# Patient Record
Sex: Female | Born: 1950 | Race: White | Hispanic: No | Marital: Married | Smoking: Never smoker
Health system: Southern US, Community
[De-identification: ages and names within clinical notes are randomized; demographics above are authoritative.]

## PROBLEM LIST (undated history)

## (undated) DIAGNOSIS — M1990 Unspecified osteoarthritis, unspecified site: Secondary | ICD-10-CM

## (undated) DIAGNOSIS — Z889 Allergy status to unspecified drugs, medicaments and biological substances status: Secondary | ICD-10-CM

## (undated) DIAGNOSIS — F32A Depression, unspecified: Secondary | ICD-10-CM

## (undated) DIAGNOSIS — C801 Malignant (primary) neoplasm, unspecified: Secondary | ICD-10-CM

## (undated) DIAGNOSIS — E039 Hypothyroidism, unspecified: Secondary | ICD-10-CM

## (undated) DIAGNOSIS — F419 Anxiety disorder, unspecified: Secondary | ICD-10-CM

## (undated) DIAGNOSIS — Z8719 Personal history of other diseases of the digestive system: Secondary | ICD-10-CM

## (undated) DIAGNOSIS — F329 Major depressive disorder, single episode, unspecified: Secondary | ICD-10-CM

## (undated) DIAGNOSIS — R7303 Prediabetes: Secondary | ICD-10-CM

## (undated) DIAGNOSIS — I10 Essential (primary) hypertension: Secondary | ICD-10-CM

## (undated) HISTORY — PX: WISDOM TOOTH EXTRACTION: SHX21

## (undated) HISTORY — PX: TONSILLECTOMY: SUR1361

## (undated) HISTORY — PX: COLONOSCOPY: SHX174

## (undated) HISTORY — PX: IUD REMOVAL: SHX5392

## (undated) HISTORY — PX: REPLACEMENT TOTAL KNEE: SUR1224

## (undated) HISTORY — PX: TUBAL LIGATION: SHX77

---

## 2002-02-13 ENCOUNTER — Ambulatory Visit (HOSPITAL_COMMUNITY): Admission: RE | Admit: 2002-02-13 | Discharge: 2002-02-13 | Payer: Self-pay | Admitting: Obstetrics and Gynecology

## 2002-02-13 ENCOUNTER — Encounter (INDEPENDENT_AMBULATORY_CARE_PROVIDER_SITE_OTHER): Payer: Self-pay | Admitting: *Deleted

## 2002-03-01 ENCOUNTER — Ambulatory Visit (HOSPITAL_COMMUNITY): Admission: RE | Admit: 2002-03-01 | Discharge: 2002-03-01 | Payer: Self-pay | Admitting: Physician Assistant

## 2002-03-01 ENCOUNTER — Encounter: Payer: Self-pay | Admitting: Obstetrics and Gynecology

## 2003-03-28 ENCOUNTER — Other Ambulatory Visit: Admission: RE | Admit: 2003-03-28 | Discharge: 2003-03-28 | Payer: Self-pay | Admitting: Obstetrics and Gynecology

## 2004-04-20 ENCOUNTER — Other Ambulatory Visit: Admission: RE | Admit: 2004-04-20 | Discharge: 2004-04-20 | Payer: Self-pay | Admitting: Obstetrics and Gynecology

## 2004-08-24 ENCOUNTER — Other Ambulatory Visit: Admission: RE | Admit: 2004-08-24 | Discharge: 2004-08-24 | Payer: Self-pay | Admitting: Obstetrics and Gynecology

## 2005-07-16 ENCOUNTER — Other Ambulatory Visit: Admission: RE | Admit: 2005-07-16 | Discharge: 2005-07-16 | Payer: Self-pay | Admitting: Obstetrics and Gynecology

## 2005-09-20 ENCOUNTER — Other Ambulatory Visit: Admission: RE | Admit: 2005-09-20 | Discharge: 2005-09-20 | Payer: Self-pay | Admitting: Obstetrics and Gynecology

## 2006-05-23 ENCOUNTER — Ambulatory Visit (HOSPITAL_COMMUNITY): Admission: RE | Admit: 2006-05-23 | Discharge: 2006-05-23 | Payer: Self-pay | Admitting: Obstetrics and Gynecology

## 2006-08-31 ENCOUNTER — Encounter (INDEPENDENT_AMBULATORY_CARE_PROVIDER_SITE_OTHER): Payer: Self-pay | Admitting: *Deleted

## 2006-08-31 ENCOUNTER — Ambulatory Visit: Admission: RE | Admit: 2006-08-31 | Discharge: 2006-08-31 | Payer: Self-pay | Admitting: Gynecologic Oncology

## 2006-08-31 ENCOUNTER — Other Ambulatory Visit: Admission: RE | Admit: 2006-08-31 | Discharge: 2006-08-31 | Payer: Self-pay | Admitting: Gynecologic Oncology

## 2008-03-07 ENCOUNTER — Encounter: Admission: RE | Admit: 2008-03-07 | Discharge: 2008-03-07 | Payer: Self-pay | Admitting: Obstetrics and Gynecology

## 2010-08-24 ENCOUNTER — Encounter: Payer: Self-pay | Admitting: Obstetrics and Gynecology

## 2010-09-11 ENCOUNTER — Other Ambulatory Visit: Payer: Self-pay | Admitting: Plastic Surgery

## 2010-12-18 NOTE — Op Note (Signed)
[facility]  Patient:    Danielle Torres, Danielle Torres Visit Number: [id number] MRN: [id number]          Service Type: DSU Location: [facility] Attending Physician:  Soledad Gerlach Dictated by:   Guy Sandifer Arleta Creek, M.D. Proc. Date: 02/13/02 Admit Date:  02/13/2002 Discharge Date: 02/13/2002                             Operative Report  PREOPERATIVE DIAGNOSES:       Retained intrauterine device.  POSTOPERATIVE DIAGNOSES:      Retained intrauterine device.  PROCEDURE:                    Hysteroscopy with dilatation and curettage.  SURGEON:                      Guy Sandifer. Arleta Creek, M.D.  ANESTHESIA:                   General with LMA.  ESTIMATED BLOOD LOSS:         Less than 30 cc.  INPUT AND OUTPUT:             Sorbitol distending media 30 cc deficit.  INDICATIONS AND CONSENT:      This patient is a 60 year old married white female G2, P2 status post tubal ligation who has an IUD in place for 25 years. Details are dictated in the history and physical.  Hysteroscopy, D&C is discussed with the patient.  Potential risks and complications were discussed including, but not limited to, infection, bowel, bladder, ureteral damage, uterine perforation, bleeding requiring transfusion of blood products with possible transfusion reaction, HIV and hepatitis acquisition, DVT, PE, pneumonia, laparotomy, laparoscopy, and hysterectomy.  All questions have been answered and consent is signed on the chart.  FINDINGS:                     The internal cervical os is somewhat stenotic. The end of the IUD is visualized immediately superior to the internal cervical os.  No other abnormal structures are noted.  PROCEDURE:                    The patient is taken to the operating room, placed in the dorsal supine position where general anesthesia is administered via LMA.  She is then placed in the dorsal lithotomy position where she is prepped, bladder straight  catheterized, and she is draped in a sterile fashion.  Bivalve speculum is placed in the vagina and the anterior cervical lip is injected with 1% Xylocaine and grasped with a single tooth tenaculum. Paracervical block is placed at the 2, 4, 5, 7, 8, and 10 oclock positions with approximately 19 cc total of 1% Xylocaine plain.  Cervix is then gently progressively dilated to a 25 Pratt dilator.  Diagnostic hysteroscope is placed and the above findings are noted.  Hysteroscope is withdrawn.  Randall stone forceps are used to grasp the IUD which is removed intact without difficulty.  Hysteroscope is then reintroduced and no abnormal structures are noted.  Hysteroscope is then withdrawn.  Sharp curettage is carried out for a scant amount of tissue.  This is sent to pathology.  All instruments are removed.  All counts are correct.  The patient is awakened and taken to recovery room in stable condition. Dictated by:   Guy Sandifer Tomblin  II, M.D. Attending Physician:  Soledad Gerlach DD:  02/13/02 TD:  02/16/02 Job: [id number]

## 2010-12-18 NOTE — H&P (Signed)
[facility]  Patient:    Danielle Torres, Danielle Torres Visit Number: [id number] MRN: [id number]          Service Type: Attending:  Guy Sandifer. Arleta Creek, M.D. Dictated by:   Guy Sandifer Arleta Creek, M.D. Adm. Date:  02/13/02                           History and Physical  REASON FOR ADMISSION:         Retained IUD.  HISTORY OF PRESENT ILLNESS:   This patient is a 60 year old, married, white female, G2, P2, status post tubal ligation, who thought her IUD was removed at the time of tubal ligation approximately 25 years ago.  She had had multiple pelvic exams with apparently no abnormality noted.  Her last menstrual period was March 2002.  She is having an occasional hot flash.  However, she began having some left lower quadrant pain about three weeks ago which migrated to the right side.  This pain comes and goes and is often times associated with movement.  She was initially evaluated by Kipp Brood in [place], [place].  That evaluation included a flat plate ultrasound which was suspicious for a pelvic structure.  Subsequent pelvic ultrasound revealed a uterus measuring 6.9 x 3.14 x 3.77 cm.  An IUD was located in the endometrial canal.  The ovaries were described as being normal, and there was no adnexal masses or fluid in the cul-de-sac.  Examination revealed an anteverted, normally-sized, mildly tender uterus and a negative adnexal exam. There were no IUD strings.  A CBC was consistent with a white count of 6.6 and a hemoglobin of 14.4.  The patient was given Levaquin orally and returned the next day for an attempted removal of the IUD.  However, I was unable to pass the instrument beyond the internal cervical os.  The patient is therefore being admitted for hysteroscopy D&C for IUD removal.  She has been maintained on Levaquin 500 mg daily.  Potential risks and complications have been discussed, and all questions have been answered.  PAST MEDICAL HISTORY:          1. Hypothyroidism.                               2. Chronic hypertension.                               3. History of colitis.  PAST SURGICAL HISTORY:        1. Tonsillectomy in 1958.                               2. Tubal ligation in 1981.  MEDICATIONS:                  1. Synthroid 30 mcg t.i.d.                               2. Norvasc 5 mg 1/2 pill q.d.                               3. Spironolactone q.d.  4. Avandia q.d.                               5. Allegra p.r.n.                               6. Potassium q.d.  ALLERGIES:                    PENICILLIN leading to swelling, shortness of breath.  ALTACE leading to mouth swelling, shortness of breath, and confusion.  FAMILY HISTORY:               Positive for diabetes in father, aunts, and uncles.  Heart disease in father.  Heart attack in father.  Skin and colon cancer in maternal grandfather and paternal grandmother.  OBSTETRIC HISTORY:            Two vaginal deliveries.  REVIEW OF SYSTEMS:            The patient complains of generalized fatigue for the past two years.  PHYSICAL EXAMINATION:  VITAL SIGNS:                  Blood pressure 148/78.  HEENT:                        Without thyromegaly.  LUNGS:                        Clear to auscultation.  HEART:                        Regular rate and rhythm.  BACK:                         Without CVA tenderness.  BREASTS:                      Without mass, retraction, discharge.  ABDOMEN:                      Soft, nontender, without masses.  PELVIC:                       Vulva, vagina, cervix without lesion.  There is no IUD string.  Uterus anteverted, normal size, mildly tender.  Adnexa nontender without masses.  EXTREMITIES/NEUROLOGIC:       Grossly within normal limits.  ASSESSMENT:                   Retained intrauterine device.  PLAN:                         Hysteroscopy D&C. Dictated by:   Guy Sandifer Arleta Creek,  M.D. Attending:  Guy Sandifer Arleta Creek, M.D. DD:  02/12/02 TD:  02/12/02 Job: [id number]

## 2010-12-18 NOTE — Consult Note (Signed)
NAMEREANNAH, Danielle Torres               ACCOUNT NO.:  [id number]   MEDICAL RECORD NO.:  [id number]          PATIENT TYPE:  OUT   LOCATION:  GYN                          FACILITY:  [facility]   PHYSICIAN:  Paola A. Duard Brady, MD    DATE OF BIRTH:  1951-02-18   DATE OF CONSULTATION:  08/31/2006  DATE OF DISCHARGE:                                 CONSULTATION   REFERRED BY:  Dr. Harold Hedge. Patient seen today in consultation at  the request of Dr. Harold Hedge.   Ms. Baltes is a 60 year old, gravida 2, para 2 who has had about a 2-3  year history of abdominal pain and was told she had colitis, has  intermittent diarrhea, etc. She has also gained approximately 35-40  pounds in the past 2 years. She attributes this to feeling tired. She is  eating about the same but she is not exercising as she used to. Because  of these symptoms of abdominal discomfort and weight gain, she had a CA  125 ordered by her primary physician, Dr. Burna Mortimer. Her first CA 125 was  done September 2007 and it was 24 with their reference range of normal  being less than 20. She was subsequently referred to Dr. Henderson Cloud. A  transvaginal ultrasound was performed that revealed no evidence of any  issues, normal appearing ovaries and uterus. She did have a CT scan of  the abdomen and pelvis October 2007 that revealed mild diffuse fatty  infiltration of the liver. There was no liver lesions noted. The  gallbladder was normal, the pancreas, spleen, adrenals and kidneys were  also normal in appearance. There was no evidence of any masses or  adenopathy. There was no evidence of an inflammatory process or abnormal  fluid collection. Abdominal loops were negative. Within the pelvis, the  uterus was normal in size, there was no evidence of any pelvic or  adnexal masses. There was no abnormal fluid collections noted. In  addition, there was no evidence of inflammatory processes or bowel  thickening. There were no dilated loops of small  bowel and it was noted  to be negative. It was recommended that she have a GI evaluation  secondary to fatty infiltration of the liver. I am not sure if that  occurred; however, she had a repeat CA 125 done December 7 that was 26,  again elevated with the range of 20. It is because of this that she is  referred to [place] today. Her symptoms and complaints are essentially as  above. She has been menopausal since the age of 60. She did hormone  replacement therapy for approximately 6 years in her perimenopausal  period, stopped it secondary to weight gain, bloating and cramping. She  currently states that her fatigue is a significant issue for her. She is  on thyroid but states that her last thyroid level was too low despite  being on Armour Thyroid and she feels that she needs an increased dose  of her thyroid medication. She denies any nausea, vomiting, early  satiety. She states that she feels bloated all the time independent of  eating and her clothes are fitting about the same other than the  acknowledged weight gain as above. She has had a negative colonoscopy  done in followup of her colitis.   MEDICATIONS:  Armour Thyroid, Lexapro, K-Dur and Adipex.   PAST MEDICAL HISTORY:  Hypothyroidism.   PAST SURGICAL HISTORY:  Tubal ligation. IUD removal. She has had 2  spontaneous vaginal deliveries.   ALLERGIES:  PENICILLIN which causes shortness of breath and swelling.  ACE inhibitor which cause swelling and confusion. SULFA causes itching.   SOCIAL HISTORY:  She denies a history of tobacco or alcohol. She is  married. She has 2 children and 3 grandchildren. 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   FAMILY HISTORY:  Significant for diabetes. Her mother had Alzheimer's.  She had a grandfather with skin cancer. There is significant cardiac  disease and congestive heart failure. She had a paternal grandmother who  had colon cancer a the age of 23.   HEALTH  MAINTENANCE:  She up-to-date on her mammogram which was March  2007. Colonoscopy she is up-to-date. Pap smears due tomorrow with Dr.  Henderson Cloud.   PHYSICAL EXAMINATION:  VITAL SIGNS:  Height 5 foot 7, weight 210 pounds,  blood pressure 138/78, pulse 80, respirations 18.  GENERAL:  Well-nourished, well-developed female in no acute distress.  NECK:  Supple, there is no lymphadenopathy. No thyromegaly.  LUNGS:  Clear to auscultation bilaterally.  CARDIOVASCULAR:  Regular rate and rhythm.  ABDOMEN:  Soft, nontender, nondistended. There are no palpable masses or  hepatosplenomegaly. Exam is somewhat limited by habitus. There is no  fluid wave, there is no tenderness. Groins are negative for adenopathy.  EXTREMITIES:  There is no edema.  PELVIC:  External genitalia is within normal limits. The vagina is  slightly atrophic with loss of rugations. The cervix is visualized. It  is multiparous. There is a physiologic discharge. There is no visible  lesions. Thin prep Pap was submitted without difficulty. Bimanual  examination limited by habitus; however, the corpus was normal size,  shape and consistency. I could not appreciate any adnexal masses.  Rectovaginal examination confirms no nodularity.   ASSESSMENT:  A 60 year old whose referred to use secondary to elevated  CA 125 which were drawn secondary to complaints of abdominal pain and  bloating that has been going on for 2-3 years. The patient has had  approximately a 35 pound weight gain in the past 2 years which she  attributes to not exercising and eating her baseline amount of food.   PLAN:  I discussed with the patient that CA 125 is a very nonspecific  test. Fatty infiltration in and of itself as well as colitis can  increase her CA 125. However, we have this elevated value and I think it  is our responsibility to continue followup with it. I would recommend a repeat CA 125 in 3 months. If it has normalized or is staying stable, I   would not continually check CA 125. This can be done locally in  [place] through Dr. Burna Mortimer office to ensure consistency with the  labs. Her questions were elicited and answered to her satisfaction. She  knows we will be happy to see her in the future should the need arise.  She was given my card as well as that of Telford Nab. Our business  cards have the fax number so that Dr. Burna Mortimer office can fax her next CA  125 to [place].  Paola A. Duard Brady, MD  Electronically Signed     PAG/MEDQ  D:  08/31/2006  T:  08/31/2006  Job:  [id number]   cc:   Telford Nab, R.N.  [address]  [place], [place] [address]   Guy Sandifer. Henderson Cloud, M.D.  Fax: [phone]   Arma Heading, MD

## 2011-01-27 ENCOUNTER — Other Ambulatory Visit: Payer: Self-pay | Admitting: Plastic Surgery

## 2012-11-27 ENCOUNTER — Other Ambulatory Visit: Payer: Self-pay | Admitting: Gastroenterology

## 2014-02-18 ENCOUNTER — Other Ambulatory Visit: Payer: Self-pay | Admitting: Plastic Surgery

## 2015-01-14 ENCOUNTER — Other Ambulatory Visit: Payer: Self-pay | Admitting: Obstetrics and Gynecology

## 2015-01-15 LAB — CYTOLOGY - PAP

## 2016-05-02 HISTORY — PX: KNEE SURGERY: SHX244

## 2017-07-01 NOTE — Patient Instructions (Addendum)
Your procedure is scheduled on: Thursday December 6 at 7:30 am  Enter through the [employer] [facility] at: 6:00 am  Pick up the phone at the desk and dial [phone].  Call this number if you have problems the morning of surgery: [phone].  Remember: Do NOT eat food or drink any liquids: after Midnight on Wednesday December 5  Take these medicines the morning of surgery with a SIP OF WATER: atenolol, lexapro,  Nature-throid, Dymista spray  STOP ALL HERBAL AND SUPPLEMENTS 1 WEEK PRIOR TO SURGERY  DO NOT SMOKE DAY OF SURGERY  Do NOT wear jewelry (body piercing), metal hair clips/bobby pins, make-up, or nail polish. Do NOT wear lotions, powders, or perfumes.  You may wear deoderant. Do NOT shave for 48 hours prior to surgery. Do NOT bring valuables to the hospital. Contacts, dentures, or bridgework may not be worn into surgery.  Leave suitcase in car.  After surgery it may be brought to your room.  For patients admitted to the hospital, checkout time is 11:00 AM the day of discharge.

## 2017-07-04 ENCOUNTER — Encounter (HOSPITAL_COMMUNITY): Payer: Self-pay

## 2017-07-04 ENCOUNTER — Encounter (HOSPITAL_COMMUNITY)
Admission: RE | Admit: 2017-07-04 | Discharge: 2017-07-04 | Disposition: A | Discharge status: Home / Self Care | Payer: Medicare Other | Source: Ambulatory Visit | Attending: Obstetrics and Gynecology | Admitting: Obstetrics and Gynecology

## 2017-07-04 ENCOUNTER — Other Ambulatory Visit: Payer: Self-pay

## 2017-07-04 DIAGNOSIS — Z01812 Encounter for preprocedural laboratory examination: Secondary | ICD-10-CM | POA: Insufficient documentation

## 2017-07-04 HISTORY — DX: Major depressive disorder, single episode, unspecified: F32.9

## 2017-07-04 HISTORY — DX: Hypothyroidism, unspecified: E03.9

## 2017-07-04 HISTORY — DX: Unspecified osteoarthritis, unspecified site: M19.90

## 2017-07-04 HISTORY — DX: Depression, unspecified: F32.A

## 2017-07-04 HISTORY — DX: Essential (primary) hypertension: I10

## 2017-07-04 HISTORY — DX: Anxiety disorder, unspecified: F41.9

## 2017-07-04 LAB — TYPE AND SCREEN
ABO/RH(D): O POS
ANTIBODY SCREEN: NEGATIVE

## 2017-07-04 LAB — ABO/RH: ABO/RH(D): O POS

## 2017-07-04 NOTE — Pre-Procedure Instructions (Signed)
Dr. Frankey Shown aware of  and  okay 'd patients history, viewed EKG and Labs patient brought with her to PAT appointment.

## 2017-07-06 NOTE — H&P (Signed)
Danielle Torres is an 66 y.o. female with symptomatic pelvic prolapse.  Pertinent Gynecological History: Menses: post-menopausal Bleeding: N/A Contraception: none DES exposure: denies Blood transfusions: none Sexually transmitted diseases: no past history Previous GYN Procedures: BTL  Last mammogram: normal Date: 2018 Last pap: normal Date: 2018 OB History: G2, P2   Menstrual History: Menarche age: unknown No LMP recorded.    Past Medical History:  Diagnosis Date  . Anxiety   . Arthritis    both knees, right knee replacement  . Depression   . Diabetes mellitus without complication ([place])    pre diabetic  . Hypertension   . Hypothyroidism     Past Surgical History:  Procedure Laterality Date  . KNEE SURGERY Right 05/2016  . TONSILLECTOMY    . TUBAL LIGATION      No family history on file.  Social History:  reports that  has never smoked. she has never used smokeless tobacco. She reports that she does not drink alcohol or use drugs.  Allergies:  Allergies  Allergen Reactions  . Ace Inhibitors Shortness Of Breath and Other (See Comments)    Confusion  . Penicillins Shortness Of Breath, Itching and Other (See Comments)    Has patient had a PCN reaction causing immediate rash, facial/tongue/throat swelling, SOB or lightheadedness with hypotension: Yes Has patient had a PCN reaction causing severe rash involving mucus membranes or skin necrosis: No Has patient had a PCN reaction that required hospitalization: No Has patient had a PCN reaction occurring within the last 10 years: No If all of the above answers are "NO", then may proceed with Cephalosporin use.   . Ciprofloxacin Itching  . Guaifenesin & Derivatives Other (See Comments)    Heart rate increased  . Hydralazine Other (See Comments)    Unknown  . Norvasc [Amlodipine Besylate] Swelling  . Sulfacetamide Itching  . Clindamycin/Lincomycin Rash    No medications prior to admission.    ROS  There were  no vitals taken for this visit. Physical Exam  Cardiovascular: Normal rate and regular rhythm.  Respiratory: Effort normal and breath sounds normal.  GI: Soft. Bowel sounds are normal.  Genitourinary:  Genitourinary Comments: Normal size mobile uterus First degree cystocoele Second degree rectocoel Adnexa without palpable masses    No results found for this or any previous visit (from the past 24 hour(s)).  No results found.  Assessment/Plan: 66 yo with symptomatic uterine prolapse D/W patient LAVH/BSO/A&P repair/possible SSLS Risks reviewed including infection, organ damage, bleeding/transfusion-HIV/HEP, DVT/PE, pneumonia, fistula, pelvic/vulvar pain  Shon Millet II 07/06/2017, 10:12 AM

## 2017-07-07 ENCOUNTER — Ambulatory Visit (HOSPITAL_COMMUNITY): Payer: Medicare Other | Admitting: Anesthesiology

## 2017-07-07 ENCOUNTER — Encounter (HOSPITAL_COMMUNITY)
Admission: AD | Disposition: A | Discharge status: Home / Self Care | Payer: Self-pay | Source: Ambulatory Visit | Attending: Obstetrics and Gynecology

## 2017-07-07 ENCOUNTER — Encounter (HOSPITAL_COMMUNITY): Payer: Self-pay | Admitting: Anesthesiology

## 2017-07-07 ENCOUNTER — Observation Stay (HOSPITAL_COMMUNITY)
Admission: AD | Admit: 2017-07-07 | Discharge: 2017-07-08 | Disposition: A | Discharge status: Home / Self Care | Payer: Medicare Other | Source: Ambulatory Visit | Attending: Obstetrics and Gynecology | Admitting: Obstetrics and Gynecology

## 2017-07-07 ENCOUNTER — Other Ambulatory Visit: Payer: Self-pay

## 2017-07-07 DIAGNOSIS — N816 Rectocele: Secondary | ICD-10-CM | POA: Insufficient documentation

## 2017-07-07 DIAGNOSIS — Z96651 Presence of right artificial knee joint: Secondary | ICD-10-CM | POA: Diagnosis not present

## 2017-07-07 DIAGNOSIS — Z882 Allergy status to sulfonamides status: Secondary | ICD-10-CM | POA: Diagnosis not present

## 2017-07-07 DIAGNOSIS — N840 Polyp of corpus uteri: Secondary | ICD-10-CM | POA: Insufficient documentation

## 2017-07-07 DIAGNOSIS — F329 Major depressive disorder, single episode, unspecified: Secondary | ICD-10-CM | POA: Insufficient documentation

## 2017-07-07 DIAGNOSIS — D259 Leiomyoma of uterus, unspecified: Secondary | ICD-10-CM | POA: Diagnosis not present

## 2017-07-07 DIAGNOSIS — N814 Uterovaginal prolapse, unspecified: Secondary | ICD-10-CM | POA: Diagnosis not present

## 2017-07-07 DIAGNOSIS — Z880 Allergy status to penicillin: Secondary | ICD-10-CM | POA: Diagnosis not present

## 2017-07-07 DIAGNOSIS — I10 Essential (primary) hypertension: Secondary | ICD-10-CM | POA: Diagnosis not present

## 2017-07-07 DIAGNOSIS — Z881 Allergy status to other antibiotic agents status: Secondary | ICD-10-CM | POA: Insufficient documentation

## 2017-07-07 DIAGNOSIS — N819 Female genital prolapse, unspecified: Secondary | ICD-10-CM | POA: Diagnosis present

## 2017-07-07 DIAGNOSIS — F419 Anxiety disorder, unspecified: Secondary | ICD-10-CM | POA: Diagnosis not present

## 2017-07-07 DIAGNOSIS — E119 Type 2 diabetes mellitus without complications: Secondary | ICD-10-CM | POA: Insufficient documentation

## 2017-07-07 DIAGNOSIS — Z9851 Tubal ligation status: Secondary | ICD-10-CM | POA: Diagnosis not present

## 2017-07-07 DIAGNOSIS — N8189 Other female genital prolapse: Secondary | ICD-10-CM | POA: Diagnosis present

## 2017-07-07 DIAGNOSIS — Z888 Allergy status to other drugs, medicaments and biological substances status: Secondary | ICD-10-CM | POA: Diagnosis not present

## 2017-07-07 DIAGNOSIS — M1712 Unilateral primary osteoarthritis, left knee: Secondary | ICD-10-CM | POA: Insufficient documentation

## 2017-07-07 DIAGNOSIS — Z79899 Other long term (current) drug therapy: Secondary | ICD-10-CM | POA: Diagnosis not present

## 2017-07-07 DIAGNOSIS — E039 Hypothyroidism, unspecified: Secondary | ICD-10-CM | POA: Diagnosis not present

## 2017-07-07 DIAGNOSIS — N800 Endometriosis of uterus: Secondary | ICD-10-CM | POA: Diagnosis not present

## 2017-07-07 HISTORY — DX: Malignant (primary) neoplasm, unspecified: C80.1

## 2017-07-07 HISTORY — PX: LAPAROSCOPIC VAGINAL HYSTERECTOMY WITH SALPINGO OOPHORECTOMY: SHX6681

## 2017-07-07 HISTORY — DX: Personal history of other diseases of the digestive system: Z87.19

## 2017-07-07 HISTORY — DX: Prediabetes: R73.03

## 2017-07-07 HISTORY — DX: Allergy status to unspecified drugs, medicaments and biological substances: Z88.9

## 2017-07-07 HISTORY — PX: RECTOCELE REPAIR: SHX761

## 2017-07-07 SURGERY — HYSTERECTOMY, VAGINAL, LAPAROSCOPY-ASSISTED, WITH SALPINGO-OOPHORECTOMY
Anesthesia: General | Site: Vagina

## 2017-07-07 MED ORDER — AZELASTINE-FLUTICASONE 137-50 MCG/ACT NA SUSP
1.0000 | Freq: Two times a day (BID) | NASAL | Status: DC
  Administered 2017-07-07 – 2017-07-08 (×2): 1 via NASAL

## 2017-07-07 MED ORDER — ACETAMINOPHEN 500 MG PO TABS: 1000.0000 mg | ORAL_TABLET | Freq: Four times a day (QID) | ORAL | Status: DC | PRN

## 2017-07-07 MED ORDER — METRONIDAZOLE IN NACL 5-0.79 MG/ML-% IV SOLN
500.0000 mg | Freq: Once | INTRAVENOUS | Status: AC
  Administered 2017-07-07: 500 mg via INTRAVENOUS
  Filled 2017-07-07: qty 100

## 2017-07-07 MED ORDER — LACTATED RINGERS IV SOLN: INTRAVENOUS | Status: DC

## 2017-07-07 MED ORDER — ESCITALOPRAM OXALATE 20 MG PO TABS
20.0000 mg | ORAL_TABLET | Freq: Every day | ORAL | Status: DC
  Administered 2017-07-08: 20 mg via ORAL
  Filled 2017-07-07: qty 1

## 2017-07-07 MED ORDER — LIDOCAINE HCL (CARDIAC) 20 MG/ML IV SOLN
INTRAVENOUS | Status: DC | PRN
  Administered 2017-07-07: 50 mg via INTRAVENOUS

## 2017-07-07 MED ORDER — LACTATED RINGERS IV SOLN
INTRAVENOUS | Status: DC
  Administered 2017-07-07 (×3): via INTRAVENOUS

## 2017-07-07 MED ORDER — ONDANSETRON HCL 4 MG/2ML IJ SOLN: 4.0000 mg | Freq: Four times a day (QID) | INTRAMUSCULAR | Status: DC | PRN

## 2017-07-07 MED ORDER — FENTANYL CITRATE (PF) 100 MCG/2ML IJ SOLN
25.0000 ug | INTRAMUSCULAR | Status: DC | PRN
Start: 2017-07-07 — End: 2017-07-07
  Administered 2017-07-07 (×2): 50 ug via INTRAVENOUS

## 2017-07-07 MED ORDER — SUGAMMADEX SODIUM 200 MG/2ML IV SOLN
INTRAVENOUS | Status: DC | PRN
  Administered 2017-07-07: 175 mg via INTRAVENOUS

## 2017-07-07 MED ORDER — SENNA 8.6 MG PO TABS
1.0000 | ORAL_TABLET | Freq: Two times a day (BID) | ORAL | Status: DC
  Administered 2017-07-07 – 2017-07-08 (×2): 8.6 mg via ORAL
  Filled 2017-07-07 (×3): qty 1

## 2017-07-07 MED ORDER — MENTHOL 3 MG MT LOZG: 1.0000 | LOZENGE | OROMUCOSAL | Status: DC | PRN

## 2017-07-07 MED ORDER — BUPIVACAINE HCL (PF) 0.5 % IJ SOLN
INTRAMUSCULAR | Status: AC
  Filled 2017-07-07: qty 30

## 2017-07-07 MED ORDER — SUGAMMADEX SODIUM 200 MG/2ML IV SOLN
INTRAVENOUS | Status: AC
  Filled 2017-07-07: qty 2

## 2017-07-07 MED ORDER — PHENYLEPHRINE HCL 10 MG/ML IJ SOLN
INTRAMUSCULAR | Status: DC | PRN
  Administered 2017-07-07: 40 ug via INTRAVENOUS

## 2017-07-07 MED ORDER — LIDOCAINE-EPINEPHRINE 0.5 %-1:200000 IJ SOLN
INTRAMUSCULAR | Status: AC
  Filled 2017-07-07: qty 1

## 2017-07-07 MED ORDER — SODIUM CHLORIDE 0.9 % IJ SOLN
INTRAMUSCULAR | Status: AC
  Filled 2017-07-07: qty 10

## 2017-07-07 MED ORDER — PHENYLEPHRINE 40 MCG/ML (10ML) SYRINGE FOR IV PUSH (FOR BLOOD PRESSURE SUPPORT)
PREFILLED_SYRINGE | INTRAVENOUS | Status: AC
  Filled 2017-07-07: qty 10

## 2017-07-07 MED ORDER — GENTAMICIN SULFATE 40 MG/ML IJ SOLN
INTRAVENOUS | Status: DC
  Filled 2017-07-07: qty 9.25

## 2017-07-07 MED ORDER — MEPERIDINE HCL 25 MG/ML IJ SOLN: 6.2500 mg | INTRAMUSCULAR | Status: DC | PRN

## 2017-07-07 MED ORDER — SODIUM CHLORIDE 0.9 % IV SOLN
INTRAVENOUS | Status: DC | PRN
  Administered 2017-07-07: 10 mL via INTRAMUSCULAR

## 2017-07-07 MED ORDER — MAGNESIUM HYDROXIDE 400 MG/5ML PO SUSP: 30.0000 mL | Freq: Every day | ORAL | Status: DC | PRN

## 2017-07-07 MED ORDER — DEXAMETHASONE SODIUM PHOSPHATE 4 MG/ML IJ SOLN
INTRAMUSCULAR | Status: DC | PRN
  Administered 2017-07-07: 10 mg via INTRAVENOUS

## 2017-07-07 MED ORDER — SOD CITRATE-CITRIC ACID 500-334 MG/5ML PO SOLN
ORAL | Status: AC
  Filled 2017-07-07: qty 15

## 2017-07-07 MED ORDER — ALUM & MAG HYDROXIDE-SIMETH 200-200-20 MG/5ML PO SUSP: 30.0000 mL | ORAL | Status: DC | PRN

## 2017-07-07 MED ORDER — MIDAZOLAM HCL 2 MG/2ML IJ SOLN
INTRAMUSCULAR | Status: AC
  Filled 2017-07-07: qty 2

## 2017-07-07 MED ORDER — SOD CITRATE-CITRIC ACID 500-334 MG/5ML PO SOLN
30.0000 mL | ORAL | Status: AC
Start: 2017-07-07 — End: 2017-07-07
  Administered 2017-07-07: 30 mL via ORAL

## 2017-07-07 MED ORDER — GLYCOPYRROLATE 0.2 MG/ML IJ SOLN
INTRAMUSCULAR | Status: DC | PRN
  Administered 2017-07-07: 0.1 mg via INTRAVENOUS

## 2017-07-07 MED ORDER — ESTRADIOL 0.1 MG/GM VA CREA
TOPICAL_CREAM | VAGINAL | Status: DC | PRN
  Administered 2017-07-07: 1 via VAGINAL

## 2017-07-07 MED ORDER — ONDANSETRON HCL 4 MG PO TABS: 4.0000 mg | ORAL_TABLET | Freq: Four times a day (QID) | ORAL | Status: DC | PRN

## 2017-07-07 MED ORDER — LIDOCAINE HCL (CARDIAC) 20 MG/ML IV SOLN
INTRAVENOUS | Status: AC
  Filled 2017-07-07: qty 5

## 2017-07-07 MED ORDER — PROPOFOL 10 MG/ML IV BOLUS
INTRAVENOUS | Status: AC
  Filled 2017-07-07: qty 20

## 2017-07-07 MED ORDER — ROCURONIUM BROMIDE 100 MG/10ML IV SOLN
INTRAVENOUS | Status: DC | PRN
  Administered 2017-07-07: 50 mg via INTRAVENOUS

## 2017-07-07 MED ORDER — ESTRADIOL 0.1 MG/GM VA CREA
TOPICAL_CREAM | VAGINAL | Status: AC
  Filled 2017-07-07: qty 42.5

## 2017-07-07 MED ORDER — THYROID 81.25 MG PO TABS: 81.2500 mg | ORAL_TABLET | Freq: Two times a day (BID) | ORAL | Status: DC

## 2017-07-07 MED ORDER — POTASSIUM CHLORIDE CRYS ER 20 MEQ PO TBCR
20.0000 meq | EXTENDED_RELEASE_TABLET | Freq: Every day | ORAL | Status: DC
  Administered 2017-07-08: 20 meq via ORAL
  Filled 2017-07-07 (×2): qty 1

## 2017-07-07 MED ORDER — SCOPOLAMINE 1 MG/3DAYS TD PT72: 1.0000 | MEDICATED_PATCH | Freq: Once | TRANSDERMAL | Status: DC

## 2017-07-07 MED ORDER — SODIUM CHLORIDE 0.9 % IJ SOLN
INTRAMUSCULAR | Status: AC
  Filled 2017-07-07: qty 100

## 2017-07-07 MED ORDER — GENTAMICIN SULFATE 40 MG/ML IJ SOLN
5.0000 mg/kg | Freq: Once | INTRAVENOUS | Status: AC
  Administered 2017-07-07: 370 mg via INTRAVENOUS
  Filled 2017-07-07: qty 9.25

## 2017-07-07 MED ORDER — HYDROMORPHONE HCL 1 MG/ML IJ SOLN
INTRAMUSCULAR | Status: AC
  Filled 2017-07-07: qty 1

## 2017-07-07 MED ORDER — HYDROMORPHONE HCL 1 MG/ML IJ SOLN: 0.2000 mg | INTRAMUSCULAR | Status: DC | PRN

## 2017-07-07 MED ORDER — MIDAZOLAM HCL 2 MG/2ML IJ SOLN
INTRAMUSCULAR | Status: DC | PRN
  Administered 2017-07-07: 1 mg via INTRAVENOUS

## 2017-07-07 MED ORDER — ONDANSETRON HCL 4 MG/2ML IJ SOLN
4.0000 mg | Freq: Once | INTRAMUSCULAR | Status: AC | PRN
  Administered 2017-07-07: 4 mg via INTRAVENOUS

## 2017-07-07 MED ORDER — ONDANSETRON HCL 4 MG/2ML IJ SOLN
INTRAMUSCULAR | Status: AC
  Filled 2017-07-07: qty 2

## 2017-07-07 MED ORDER — ROCURONIUM BROMIDE 100 MG/10ML IV SOLN
INTRAVENOUS | Status: AC
  Filled 2017-07-07: qty 1

## 2017-07-07 MED ORDER — ATENOLOL 25 MG PO TABS
25.0000 mg | ORAL_TABLET | Freq: Every day | ORAL | Status: DC
  Administered 2017-07-08: 25 mg via ORAL
  Filled 2017-07-07: qty 1

## 2017-07-07 MED ORDER — PROPOFOL 10 MG/ML IV BOLUS
INTRAVENOUS | Status: DC | PRN
  Administered 2017-07-07: 110 mg via INTRAVENOUS
  Administered 2017-07-07: 25 mg via INTRAVENOUS

## 2017-07-07 MED ORDER — DEXAMETHASONE SODIUM PHOSPHATE 10 MG/ML IJ SOLN
INTRAMUSCULAR | Status: AC
  Filled 2017-07-07: qty 1

## 2017-07-07 MED ORDER — ZOLPIDEM TARTRATE 5 MG PO TABS: 5.0000 mg | ORAL_TABLET | Freq: Every evening | ORAL | Status: DC | PRN

## 2017-07-07 MED ORDER — SODIUM CHLORIDE 0.9 % IR SOLN
Status: DC | PRN
  Administered 2017-07-07: 3000 mL

## 2017-07-07 MED ORDER — IBUPROFEN 600 MG PO TABS
600.0000 mg | ORAL_TABLET | Freq: Four times a day (QID) | ORAL | Status: DC | PRN
  Administered 2017-07-07 – 2017-07-08 (×3): 600 mg via ORAL
  Filled 2017-07-07 (×3): qty 1

## 2017-07-07 MED ORDER — BUPIVACAINE HCL (PF) 0.5 % IJ SOLN
INTRAMUSCULAR | Status: DC | PRN
  Administered 2017-07-07: 10 mL
  Administered 2017-07-07: 20 mL

## 2017-07-07 MED ORDER — THYROID 32.5 MG PO TABS
81.2500 mg | ORAL_TABLET | Freq: Two times a day (BID) | ORAL | Status: DC
  Administered 2017-07-07 – 2017-07-08 (×2): 81.25 mg via ORAL

## 2017-07-07 MED ORDER — LACTATED RINGERS IV SOLN
INTRAVENOUS | Status: DC
  Administered 2017-07-07: 18:00:00 via INTRAVENOUS

## 2017-07-07 MED ORDER — FENTANYL CITRATE (PF) 100 MCG/2ML IJ SOLN
INTRAMUSCULAR | Status: AC
  Administered 2017-07-07: 50 ug via INTRAVENOUS
  Filled 2017-07-07: qty 2

## 2017-07-07 MED ORDER — FENTANYL CITRATE (PF) 250 MCG/5ML IJ SOLN
INTRAMUSCULAR | Status: AC
  Filled 2017-07-07: qty 5

## 2017-07-07 MED ORDER — OXYCODONE HCL 5 MG PO TABS: 5.0000 mg | ORAL_TABLET | ORAL | Status: DC | PRN

## 2017-07-07 MED ORDER — SODIUM CHLORIDE 0.9 % IJ SOLN
INTRAMUSCULAR | Status: DC | PRN
  Administered 2017-07-07: 10 mL

## 2017-07-07 MED ORDER — FENTANYL CITRATE (PF) 100 MCG/2ML IJ SOLN
INTRAMUSCULAR | Status: DC | PRN
  Administered 2017-07-07 (×5): 50 ug via INTRAVENOUS

## 2017-07-07 SURGICAL SUPPLY — 51 items
CABLE HIGH FREQUENCY MONO STRZ (ELECTRODE) IMPLANT
CATH ROBINSON RED A/P 16FR (CATHETERS) ×4 IMPLANT
CLOTH BEACON ORANGE TIMEOUT ST (SAFETY) ×4 IMPLANT
CONT PATH 16OZ SNAP LID 3702 (MISCELLANEOUS) ×4 IMPLANT
COVER BACK TABLE 60X90IN (DRAPES) ×4 IMPLANT
COVER MAYO STAND STRL (DRAPES) IMPLANT
DECANTER SPIKE VIAL GLASS SM (MISCELLANEOUS) ×8 IMPLANT
DERMABOND ADVANCED (GAUZE/BANDAGES/DRESSINGS) ×2
DERMABOND ADVANCED .7 DNX12 (GAUZE/BANDAGES/DRESSINGS) ×2 IMPLANT
DRSG OPSITE POSTOP 3X4 (GAUZE/BANDAGES/DRESSINGS) ×4 IMPLANT
DURAPREP 26ML APPLICATOR (WOUND CARE) ×4 IMPLANT
ELECT REM PT RETURN 9FT ADLT (ELECTROSURGICAL) ×4
ELECTRODE REM PT RTRN 9FT ADLT (ELECTROSURGICAL) ×2 IMPLANT
FILTER SMOKE EVAC LAPAROSHD (FILTER) IMPLANT
GLOVE BIO SURGEON STRL SZ7.5 (GLOVE) ×8 IMPLANT
GLOVE BIOGEL PI IND STRL 6.5 (GLOVE) ×2 IMPLANT
GLOVE BIOGEL PI IND STRL 7.0 (GLOVE) ×4 IMPLANT
GLOVE BIOGEL PI IND STRL 8 (GLOVE) ×2 IMPLANT
GLOVE BIOGEL PI INDICATOR 6.5 (GLOVE) ×2
GLOVE BIOGEL PI INDICATOR 7.0 (GLOVE) ×4
GLOVE BIOGEL PI INDICATOR 8 (GLOVE) ×2
GOWN STRL REUS W/ TWL XL LVL3 (GOWN DISPOSABLE) ×4 IMPLANT
GOWN STRL REUS W/TWL XL LVL3 (GOWN DISPOSABLE) ×4
LEGGING LITHOTOMY PAIR STRL (DRAPES) ×4 IMPLANT
LIGASURE IMPACT 36 18CM CVD LR (INSTRUMENTS) ×4 IMPLANT
NEEDLE INSUFFLATION 120MM (ENDOMECHANICALS) ×4 IMPLANT
NS IRRIG 1000ML POUR BTL (IV SOLUTION) ×4 IMPLANT
PACK LAVH (CUSTOM PROCEDURE TRAY) ×4 IMPLANT
PACK ROBOTIC GOWN (GOWN DISPOSABLE) ×4 IMPLANT
PACK TRENDGUARD 450 HYBRID PRO (MISCELLANEOUS) ×2 IMPLANT
PACK TRENDGUARD 600 HYBRD PROC (MISCELLANEOUS) IMPLANT
PROTECTOR NERVE ULNAR (MISCELLANEOUS) ×4 IMPLANT
SCISSORS LAP 5X45 EPIX DISP (ENDOMECHANICALS) IMPLANT
SEALER TISSUE G2 CVD JAW 45CM (ENDOMECHANICALS) ×4 IMPLANT
SET CYSTO W/LG BORE CLAMP LF (SET/KITS/TRAYS/PACK) IMPLANT
SET IRRIG TUBING LAPAROSCOPIC (IRRIGATION / IRRIGATOR) ×4 IMPLANT
SLEEVE XCEL OPT CAN 5 100 (ENDOMECHANICALS) IMPLANT
SUT MNCRL 0 MO-4 VIOLET 18 CR (SUTURE) ×6 IMPLANT
SUT MNCRL 0 VIOLET 6X18 (SUTURE) ×2 IMPLANT
SUT MONOCRYL 0 6X18 (SUTURE) ×2
SUT MONOCRYL 0 MO 4 18  CR/8 (SUTURE) ×6
SUT VIC AB 4-0 PS2 27 (SUTURE) ×4 IMPLANT
SUT VICRYL 0 UR6 27IN ABS (SUTURE) ×4 IMPLANT
SYR 30ML LL (SYRINGE) ×4 IMPLANT
TOWEL OR 17X24 6PK STRL BLUE (TOWEL DISPOSABLE) ×12 IMPLANT
TRAY FOLEY CATH SILVER 14FR (SET/KITS/TRAYS/PACK) ×4 IMPLANT
TRENDGUARD 450 HYBRID PRO PACK (MISCELLANEOUS) ×4
TRENDGUARD 600 HYBRID PROC PK (MISCELLANEOUS)
TROCAR XCEL NON-BLD 11X100MML (ENDOMECHANICALS) ×4 IMPLANT
TROCAR XCEL NON-BLD 5MMX100MML (ENDOMECHANICALS) ×4 IMPLANT
WARMER LAPAROSCOPE (MISCELLANEOUS) ×4 IMPLANT

## 2017-07-07 NOTE — Anesthesia Preprocedure Evaluation (Addendum)
Anesthesia Evaluation    Reviewed: Allergy & Precautions, Patient's Chart, lab work & pertinent test results, reviewed documented beta blocker date and time   Airway Mallampati: II  TM Distance: >3 FB Neck ROM: Full    Dental no notable dental hx.    Pulmonary    Pulmonary exam normal breath sounds clear to auscultation       Cardiovascular hypertension, Pt. on home beta blockers Normal cardiovascular exam Rhythm:Regular Rate:Normal     Neuro/Psych    GI/Hepatic   Endo/Other  diabetes  Renal/GU      Musculoskeletal   Abdominal   Peds  Hematology   Anesthesia Other Findings   Reproductive/Obstetrics                            Anesthesia Physical Anesthesia Plan  ASA: II  Anesthesia Plan: General   Post-op Pain Management:    Induction: Intravenous  PONV Risk Score and Plan: 4 or greater and Scopolamine patch - Pre-op, Dexamethasone and Ondansetron  Airway Management Planned: Oral ETT  Additional Equipment:   Intra-op Plan:   Post-operative Plan: Extubation in OR  Informed Consent:   Plan Discussed with:   Anesthesia Plan Comments:         Anesthesia Quick Evaluation

## 2017-07-07 NOTE — Anesthesia Procedure Notes (Signed)
Procedure Name: Intubation Date/Time: 07/07/2017 7:34 AM Performed by: Flossie Dibble, CRNA Pre-anesthesia Checklist: Patient identified, Patient being monitored, Timeout performed, Emergency Drugs available and Suction available Patient Re-evaluated:Patient Re-evaluated prior to induction Oxygen Delivery Method: Circle System Utilized Preoxygenation: Pre-oxygenation with 100% oxygen Induction Type: IV induction Ventilation: Mask ventilation without difficulty Laryngoscope Size: Mac and 3 Grade View: Grade II Tube type: Oral Tube size: 7.0 mm Number of attempts: 1 Airway Equipment and Method: stylet Placement Confirmation: ETT inserted through vocal cords under direct vision,  positive ETCO2 and breath sounds checked- equal and bilateral Secured at: 21 cm Tube secured with: Tape Dental Injury: Teeth and Oropharynx as per pre-operative assessment

## 2017-07-07 NOTE — Brief Op Note (Signed)
07/07/2017  9:08 AM  PATIENT:  Erik Obey  66 y.o. female  PRE-OPERATIVE DIAGNOSIS:  pelvic prolapse  POST-OPERATIVE DIAGNOSIS:  pelvic prolapse  PROCEDURE:  Procedure(s): LAPAROSCOPIC ASSISTED VAGINAL HYSTERECTOMY WITH SALPINGO OOPHORECTOMY (Bilateral) POSTERIOR REPAIR (RECTOCELE) (N/A)  SURGEON:  Surgeon(s) and Role:    * Everlene Farrier, MD - Primary    * Arvella Nigh, MD - Assisting  PHYSICIAN ASSISTANT:   ASSISTANTS: none   ANESTHESIA:   general  EBL:  100 mL   BLOOD ADMINISTERED:none  DRAINS: Urinary Catheter (Foley)   LOCAL MEDICATIONS USED:  MARCAINE    and LIDOCAINE   SPECIMEN:  Source of Specimen:  uterus,bilateral tubes/ovaries  DISPOSITION OF SPECIMEN:  PATHOLOGY  COUNTS:  YES  TOURNIQUET:  * No tourniquets in log *  DICTATION: .Other Dictation: Dictation Number [id number]  PLAN OF CARE: Admit for overnight observation  PATIENT DISPOSITION:  PACU - hemodynamically stable.   Delay start of Pharmacological VTE agent (>24hrs) due to surgical blood loss or risk of bleeding: not applicable

## 2017-07-07 NOTE — Anesthesia Postprocedure Evaluation (Signed)
Anesthesia Post Note  Patient: Danielle Torres  Procedure(s) Performed: LAPAROSCOPIC ASSISTED VAGINAL HYSTERECTOMY WITH SALPINGO OOPHORECTOMY (Bilateral Abdomen) POSTERIOR REPAIR (RECTOCELE) (N/A Vagina )     Patient location during evaluation: PACU Anesthesia Type: General Level of consciousness: awake and alert Pain management: pain level controlled Vital Signs Assessment: post-procedure vital signs reviewed and stable Respiratory status: spontaneous breathing, nonlabored ventilation, respiratory function stable and patient connected to nasal cannula oxygen Cardiovascular status: blood pressure returned to baseline and stable Postop Assessment: no apparent nausea or vomiting Anesthetic complications: no    Last Vitals:  Vitals:   07/07/17 1047 07/07/17 1100  BP:  (!) 119/54  Pulse:  72  Resp:  12  Temp: 37.1 C 36.9 C  SpO2:  97%    Last Pain:  Vitals:   07/07/17 1100  TempSrc: Oral  PainSc: 2    Pain Goal: Patients Stated Pain Goal: 3 (07/07/17 1610)               [name]

## 2017-07-07 NOTE — Transfer of Care (Signed)
Immediate Anesthesia Transfer of Care Note  Patient: Danielle Torres  Procedure(s) Performed: LAPAROSCOPIC ASSISTED VAGINAL HYSTERECTOMY WITH SALPINGO OOPHORECTOMY (Bilateral Abdomen) POSTERIOR REPAIR (RECTOCELE) (N/A Vagina )  Patient Location: PACU  Anesthesia Type:General  Level of Consciousness: awake, alert  and oriented  Airway & Oxygen Therapy: Patient Spontanous Breathing and Patient connected to nasal cannula oxygen  Post-op Assessment: Report given to RN and Post -op Vital signs reviewed and stable  Post vital signs: Reviewed and stable  Last Vitals:  Vitals:   07/07/17 0613  BP: (!) 142/65  Pulse: 61  Resp: 16  Temp: 36.9 C  SpO2: 97%    Last Pain:  Vitals:   07/07/17 0613  TempSrc: Oral      Patients Stated Pain Goal: 3 (15/94/58 5929)  Complications: No apparent anesthesia complications

## 2017-07-07 NOTE — Progress Notes (Signed)
Excellent pain control Tolerating regular diet  Vitals:   07/07/17 1200 07/07/17 1600  BP: (!) 124/57 (!) 119/56  Pulse: 67 67  Resp: 16 16  Temp: 98.7 F (37.1 C) 98.2 F (36.8 C)  SpO2: 97% 97%  UO clear Lungs CTA Cor RRR Abdomen soft, good BS, incisions OK LE PAS on  A/P: Stable-per orders

## 2017-07-07 NOTE — Anesthesia Postprocedure Evaluation (Signed)
Anesthesia Post Note  Patient: Danielle Torres  Procedure(s) Performed: LAPAROSCOPIC ASSISTED VAGINAL HYSTERECTOMY WITH SALPINGO OOPHORECTOMY (Bilateral Abdomen) POSTERIOR REPAIR (RECTOCELE) (N/A Vagina )     Patient location during evaluation: [facility] Anesthesia Type: General Level of consciousness: awake Pain management: pain level controlled Vital Signs Assessment: post-procedure vital signs reviewed and stable Respiratory status: spontaneous breathing Cardiovascular status: stable Postop Assessment: no apparent nausea or vomiting and adequate PO intake Anesthetic complications: no    Last Vitals:  Vitals:   07/07/17 1100 07/07/17 1200  BP: (!) 119/54 (!) 124/57  Pulse: 72 67  Resp: 12 16  Temp: 36.9 C 37.1 C  SpO2: 97% 97%    Last Pain:  Vitals:   07/07/17 1200  TempSrc: Oral  PainSc:    Pain Goal: Patients Stated Pain Goal: 3 (07/07/17 1100)               Prisha Hiley

## 2017-07-07 NOTE — Progress Notes (Signed)
Patient states she gets rash with clindamycin/lincomycin. Will use gentamycin and metronidazole for antibiotic prophylaxis>D/W pharmacy Reviewed with patient procedure-LAVH/BSO/;A&P repair, poss SSLS She states she understands and agrees

## 2017-07-07 NOTE — Addendum Note (Signed)
Addendum  created 07/07/17 1213 by Ignacia Bayley, CRNA   Sign clinical note

## 2017-07-08 ENCOUNTER — Encounter (HOSPITAL_COMMUNITY): Payer: Self-pay | Admitting: Obstetrics and Gynecology

## 2017-07-08 DIAGNOSIS — N814 Uterovaginal prolapse, unspecified: Secondary | ICD-10-CM | POA: Diagnosis not present

## 2017-07-08 LAB — CBC
HEMATOCRIT: 38.8 % (ref 36.0–46.0)
HEMOGLOBIN: 12.8 g/dL (ref 12.0–15.0)
MCH: 28.9 pg (ref 26.0–34.0)
MCHC: 33 g/dL (ref 30.0–36.0)
MCV: 87.6 fL (ref 78.0–100.0)
Platelets: 178 10*3/uL (ref 150–400)
RBC: 4.43 MIL/uL (ref 3.87–5.11)
RDW: 13.8 % (ref 11.5–15.5)
WBC: 13.3 10*3/uL — ABNORMAL HIGH (ref 4.0–10.5)

## 2017-07-08 MED ORDER — IBUPROFEN 600 MG PO TABS: 600.0000 mg | ORAL_TABLET | Freq: Four times a day (QID) | ORAL | 0 refills | Status: AC | PRN

## 2017-07-08 MED ORDER — ACETAMINOPHEN 500 MG PO TABS: 1000.0000 mg | ORAL_TABLET | Freq: Four times a day (QID) | ORAL | 0 refills | Status: AC | PRN

## 2017-07-08 MED ORDER — OXYCODONE HCL 5 MG PO TABS: 5.0000 mg | ORAL_TABLET | Freq: Four times a day (QID) | ORAL | 0 refills | Status: AC | PRN

## 2017-07-08 NOTE — Progress Notes (Signed)
Vaginal packing removed, minimal serosanguinous drainage noted on packing. Patient tolerated removal well.

## 2017-07-08 NOTE — Progress Notes (Signed)
Pt discharged home with printed instructions. Pt verbalized an understanding. No concerns noted. Katheline Brendlinger L Halim Surrette, RN 

## 2017-07-08 NOTE — Op Note (Signed)
NAME:  Danielle Torres, Danielle Torres                    ACCOUNT NO.:  MEDICAL RECORD NO.:  [id number]  LOCATION:                                 FACILITY:  PHYSICIAN:  Daleen Bo. Gaetano Net, M.D.      DATE OF BIRTH:  DATE OF PROCEDURE:  07/07/2017 DATE OF DISCHARGE:                              OPERATIVE REPORT   PREOPERATIVE DIAGNOSIS:  Pelvic prolapse.  POSTOPERATIVE DIAGNOSIS:  Pelvic prolapse.  PROCEDURES:  Laparoscopically-assisted vaginal hysterectomy, bilateral salpingo-oophorectomy, posterior vaginal repair.  SURGEON:  Daleen Bo. Gaetano Net, M.D.  ASSISTANT:  Darlyn Chamber, M.D.  ANESTHESIA:  General with endotracheal intubation, Heriberto Antigua, M.D.  ESTIMATED BLOOD LOSS:  150 mL.  SPECIMENS:  Uterus, bilateral fallopian tubes and ovaries to Pathology.  INDICATIONS AND CONSENT:  This patient is a 66 year old, G2, P2, with symptomatic pelvic relaxation.  Details have been dictated in history and physical.  LAVH, BSO, anterior-posterior vaginal repair, and possible sacrospinous ligament suspension have been discussed preoperatively.  Potential risks and complications have been discussed preoperatively including, but not limited to, infection, organ damage, bleeding requiring transfusion of blood products with HIV and hepatitis acquisition, DVT, PE, pneumonia, fistula formation, pelvic pain, vulvar pain, painful intercourse, laparotomy, return to the OR, and recurrent prolapse.  The patient states she understands and agrees.  Consent was signed on the chart.  FINDINGS:  Upper abdomen is grossly normal.  Uterus is normal in size. Retroverted tubes, status post ligation bilaterally and ovaries were normal.  DESCRIPTION OF PROCEDURE:  The patient was taken to the operating room, where she was identified, placed in dorsal supine position, and general anesthesia was induced via endotracheal intubation.  She was placed in a dorsal lithotomy position.  Time-out was undertaken.  She was  prepped vaginally with Betadine and straight catheterized and abdominally with ChloraPrep.  After a 3-minute drying time, she was draped in a sterile fashion.  A Hulka tenaculum has also been placed in the uterus as a manipulator.  The infraumbilical and suprapubic areas were injected with 0.5% plain Marcaine, 9 mL total.  A small infraumbilical incision was made and a disposable Veress needle was placed on the first attempt without difficulty.  Good syringe and drop test were noted.  2 L of gas was then insufflated under low pressure with good tympany in the right upper quadrant.  Veress needle was removed and a 10/11 Xcel bladeless disposable trocar sleeve was placed using direct visualization with the diagnostic scope.  The operative scope was then used.  A small suprapubic incision was made in the midline and a 5-mm disposable trocar sleeve was placed under direct visualization without difficulty.  The above findings were noted.  Then using the EnSeal bipolar cautery cutting instrument, the infundibulopelvic ligament was taken down, coming across the round ligament down the level of vesicouterine peritoneum.  Similar procedure was carried out on the left side. Vesicouterine peritoneum was taken down cephalad laterally.  Good hemostasis was noted.  Suprapubic trocar sleeve was removed. Instruments are removed and attention was turned to the vagina. Posterior cul-de-sac was entered sharply.  Cervix was circumscribed with unipolar cautery.  Mucosa was advanced sharply  and bluntly.  Then, using the LigaSure handheld bipolar cautery cutting instrument, the uterosacral ligaments were taken down followed by the bladder pillars, cardinal ligaments bilaterally.  This allows easy delivery of the fundus posteriorly and the remaining peritoneal attachments are taken down without difficulty.  All sutures will be 0 Monocryl unless otherwise designated.  Uterosacral ligaments were plicated to the  vaginal cuff bilaterally.  Additional suture was placed in the 3 and 9 o'clock position to assure hemostasis.  Uterosacral ligaments were then plicated with 2 separate sutures.  Cuff was then closed with figure-of-eights. Careful inspection reveals adequate support of the bladder anteriorly. 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  Therefore, posterior vaginal repair was done.  Posterior vaginal mucosa was injected with 1% lidocaine with epinephrine diluted in 100 mL of saline.  A small shallow diamond-shaped wedge of tissue was removed from the posterior perineal body.  Posterior vaginal mucosa was then taken down the midline to about 2 cm below the vaginal cuff.  The mucosa was then dissected bilaterally sharply and bluntly.  This mobilizes the tissues well.  Then, using interrupted 0 Monocryl suture, the rectovaginal fascia was plicated in the midline, offering good support.  Excess mucosa was trimmed.  A 2-0 Monocryl was used to close the mucosa in a running locking fashion.  This was also used to reapproximate the perineal body.  A Foley catheter was placed and clear urine was noted.  Vaginal packing was placed.  Attention was returned to the abdomen.  Pneumoperitoneum was reintroduced.  Suprapubic trocar sleeve was reintroduced under direct visualization.  Lavage was carried out.  Minor oozing at peritoneal edges was controlled with bipolar cautery.  Inspection under reduced pneumoperitoneum revealed good hemostasis.  The remaining approximately 21 mL of 0.5% plain Marcaine was instilled into the peritoneal cavity.  Suprapubic trocar sleeve was removed.  Pneumoperitoneum was reduced and the umbilical trocar sleeve was removed.  Umbilical incision was closed with 0 Monocryl in the subcutaneous layer under good visualization.  Skin was closed on both with 3-0 Vicryl interrupted.  Glue was applied.  All counts were correct.  The patient was awakened and taken to the recovery room in  stable.     Daleen Bo Gaetano Net, M.D.     JET/MEDQ  D:  07/07/2017  T:  07/07/2017  Job:  [id number]

## 2017-07-08 NOTE — Progress Notes (Signed)
Feels good Void x 2, + flatus, tolerating regular diet, ambulating  Vitals:   07/08/17 0423 07/08/17 0814  BP: (!) 110/51 (!) 115/47  Pulse: 60 65  Resp: 17 16  Temp: 98 F (36.7 C) 97.9 F (36.6 C)  SpO2: 98% 96%    Abdomen soft  Results for orders placed or performed during the hospital encounter of 07/07/17 (from the past 24 hour(s))  CBC     Status: Abnormal   Collection Time: 07/08/17  5:56 AM  Result Value Ref Range   WBC 13.3 (H) 4.0 - 10.5 K/uL   RBC 4.43 3.87 - 5.11 MIL/uL   Hemoglobin 12.8 12.0 - 15.0 g/dL   HCT 38.8 36.0 - 46.0 %   MCV 87.6 78.0 - 100.0 fL   MCH 28.9 26.0 - 34.0 pg   MCHC 33.0 30.0 - 36.0 g/dL   RDW 13.8 11.5 - 15.5 %   Platelets 178 150 - 400 K/uL   A/P: D/C home         Instructions given

## 2017-07-08 NOTE — Discharge Summary (Signed)
Physician Discharge Summary  Patient ID: Danielle Torres MRN: [id number] DOB/AGE: [birth year] 66 y.o.  Admit date: 07/07/2017 Discharge date: 07/08/2017  Admission Diagnoses:Pelvic Prolapse  Discharge Diagnoses:  Active Problems:   Pelvic prolapse   Discharged Condition: good  Hospital Course: had good postoperative resumption of bowel and bladder function, tolerating regular diet, ambulating with good pain relief  Consults: None  Significant Diagnostic Studies: labs:  Results for orders placed or performed during the hospital encounter of 07/07/17 (from the past 24 hour(s))  CBC     Status: Abnormal   Collection Time: 07/08/17  5:56 AM  Result Value Ref Range   WBC 13.3 (H) 4.0 - 10.5 K/uL   RBC 4.43 3.87 - 5.11 MIL/uL   Hemoglobin 12.8 12.0 - 15.0 g/dL   HCT 38.8 36.0 - 46.0 %   MCV 87.6 78.0 - 100.0 fL   MCH 28.9 26.0 - 34.0 pg   MCHC 33.0 30.0 - 36.0 g/dL   RDW 13.8 11.5 - 15.5 %   Platelets 178 150 - 400 K/uL    Treatments: surgery: LAVH/BSO/posterior vaginal repair  Discharge Exam: Blood pressure (!) 115/47, pulse 65, temperature 97.9 F (36.6 C), temperature source Oral, resp. rate 16, height 5\' 8"  (1.727 m), weight 192 lb (87.1 kg), SpO2 96 %. General appearance: alert, cooperative and no distress GI: soft, non-tender; bowel sounds normal; no masses,  no organomegaly  Disposition: Final discharge disposition not confirmed  Discharge Instructions    Discharge patient   Complete by:  As directed    Discharge disposition:  01-Home or Self Care   Discharge patient date:  07/08/2017     Allergies as of 07/08/2017      Reactions   Ace Inhibitors Shortness Of Breath, Other (See Comments)   Confusion   Penicillins Shortness Of Breath, Itching, Other (See Comments)   Has patient had a PCN reaction causing immediate rash, facial/tongue/throat swelling, SOB or lightheadedness with hypotension: Yes Has patient had a PCN reaction causing severe rash involving mucus  membranes or skin necrosis: No Has patient had a PCN reaction that required hospitalization: No Has patient had a PCN reaction occurring within the last 10 years: No If all of the above answers are "NO", then may proceed with Cephalosporin use.   Ciprofloxacin Itching   Guaifenesin & Derivatives Other (See Comments)   Heart rate increased   Hydralazine Other (See Comments)   Unknown   Norvasc [amlodipine Besylate] Swelling   Sulfacetamide Itching   Clindamycin/lincomycin Rash      Medication List    STOP taking these medications   naproxen sodium 220 MG tablet Commonly known as:  ALEVE     TAKE these medications   acetaminophen 500 MG tablet Commonly known as:  TYLENOL Take 2 tablets (1,000 mg total) by mouth every 6 (six) hours as needed for mild pain.   atenolol 25 MG tablet Commonly known as:  TENORMIN Take 25 mg by mouth daily.   BERBERINE COMPLEX PO Take 1 capsule by mouth daily.   cholecalciferol 400 units Tabs tablet Commonly known as:  VITAMIN D Take 800 Units by mouth daily.   DHEA 25 MG Caps Take 25 mg by mouth daily.   DYMISTA 137-50 MCG/ACT Susp Generic drug:  Azelastine-Fluticasone Place 1 spray into both nostrils 2 (two) times daily.   escitalopram 20 MG tablet Commonly known as:  LEXAPRO Take 20 mg by mouth daily.   ibuprofen 600 MG tablet Commonly known as:  ADVIL,MOTRIN Take 1 tablet (  600 mg total) by mouth every 6 (six) hours as needed (mild pain).   L-METHIONINE PO Take 1 tablet by mouth daily.   NATURE-THROID 81.25 MG Tabs Generic drug:  Thyroid Take 81.25 mg by mouth 2 (two) times daily.   oxyCODONE 5 MG immediate release tablet Commonly known as:  Oxy IR/ROXICODONE Take 1 tablet (5 mg total) by mouth every 6 (six) hours as needed for moderate pain.   potassium chloride SA 20 MEQ tablet Commonly known as:  K-DUR,KLOR-CON Take 20 mEq by mouth daily.   PROBIOTIC PO Take 1 capsule by mouth daily.   triamcinolone cream 0.5  % Commonly known as:  KENALOG Apply 1 application topically as needed (for itching).   vitamin C 1000 MG tablet Take 1,000 mg by mouth daily.   Vitamin K2 100 MCG Caps Take 100 mcg by mouth daily.        Signed: Shon Millet II 07/08/2017, 10:15 AM

## 2017-09-16 ENCOUNTER — Other Ambulatory Visit: Payer: Self-pay | Admitting: Obstetrics and Gynecology

## 2017-09-16 DIAGNOSIS — N6320 Unspecified lump in the left breast, unspecified quadrant: Secondary | ICD-10-CM

## 2017-09-21 ENCOUNTER — Other Ambulatory Visit: Payer: Medicare Other

## 2017-09-23 ENCOUNTER — Ambulatory Visit
Admission: RE | Admit: 2017-09-23 | Discharge: 2017-09-23 | Disposition: A | Discharge status: Home / Self Care | Payer: Medicare Other | Source: Ambulatory Visit | Attending: Obstetrics and Gynecology | Admitting: Obstetrics and Gynecology

## 2017-09-23 DIAGNOSIS — N6320 Unspecified lump in the left breast, unspecified quadrant: Secondary | ICD-10-CM

## 2019-08-07 IMAGING — US ULTRASOUND LEFT BREAST LIMITED
1 series · 6 of 6 positions shown · non-contrast
Comparison: Previous exam(s).

ADDENDUM:
Patient's follow-up screening study should be in April 2018,
which is 1 year after previous bilateral screening study.
CLINICAL DATA: Patient presents with a small palpable mass in the
lateral, periareolar left breast.

EXAM:
DIGITAL DIAGNOSTIC LEFT MAMMOGRAM WITH CAD AND TOMO
ULTRASOUND LEFT BREAST

[Series 1: ultrasound left breast limited · 0.06mm/px · 6 of 6 slices shown]
[im 1/6]
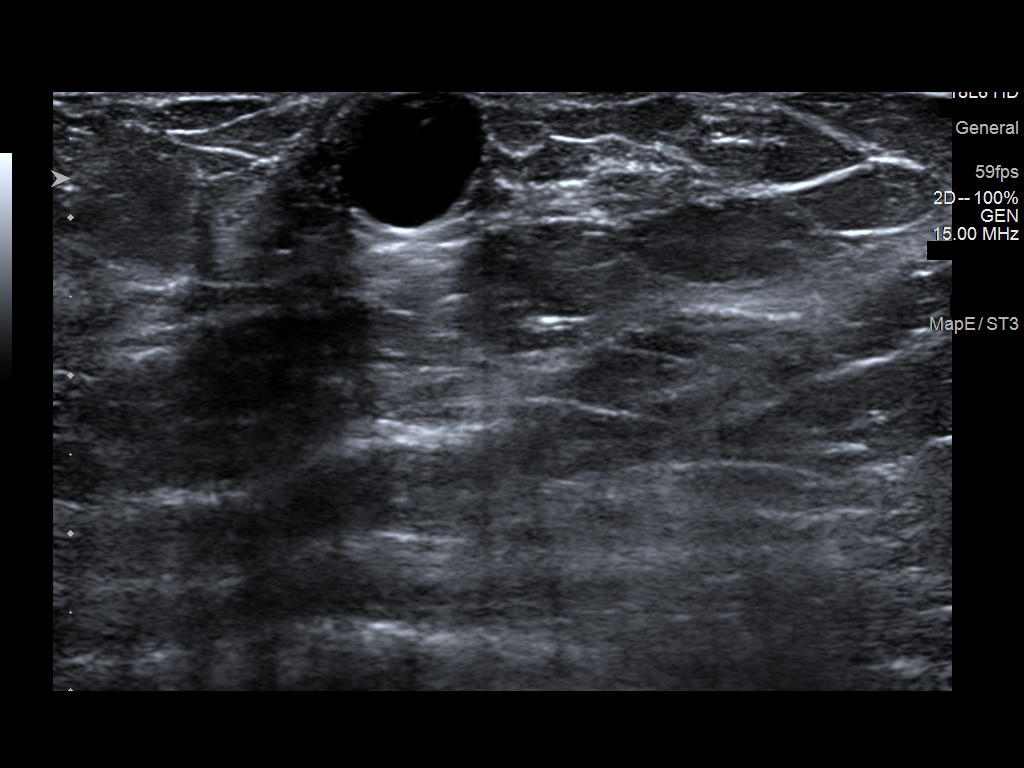
[im 2/6]
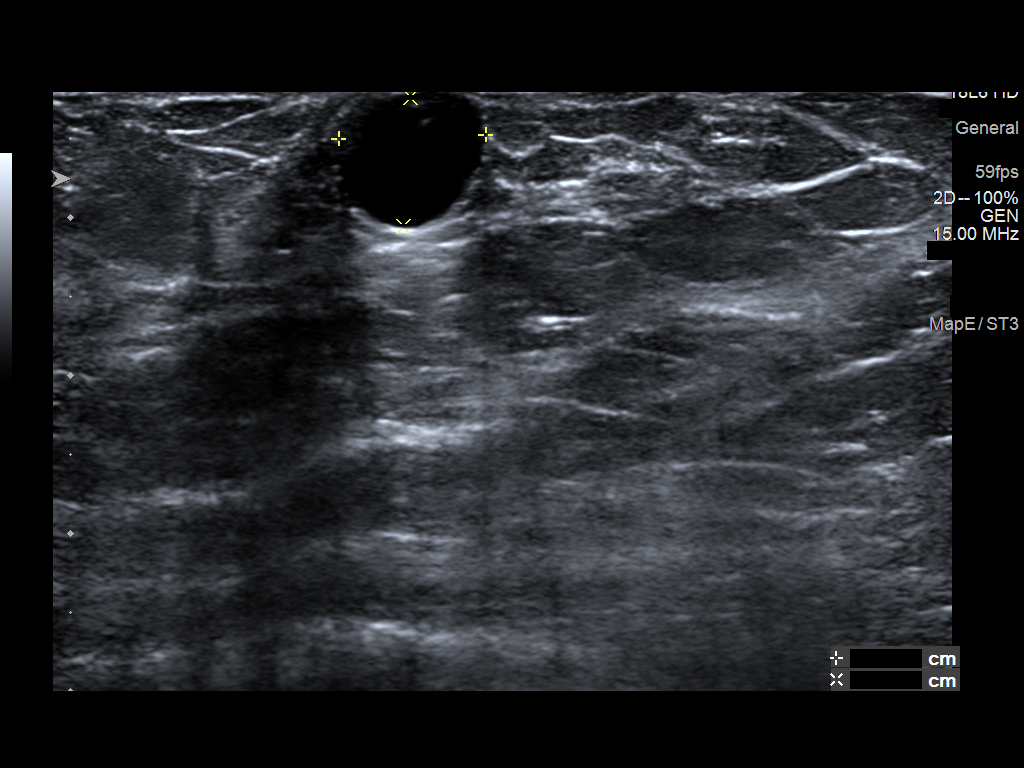
[im 3/6]
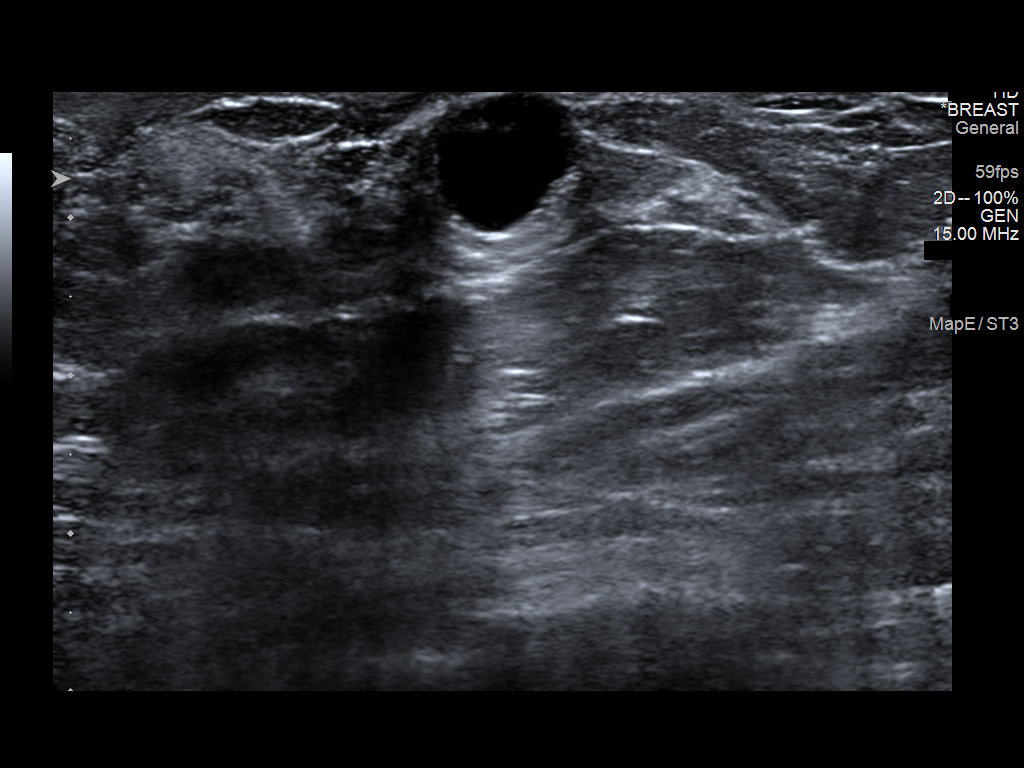
[im 4/6]
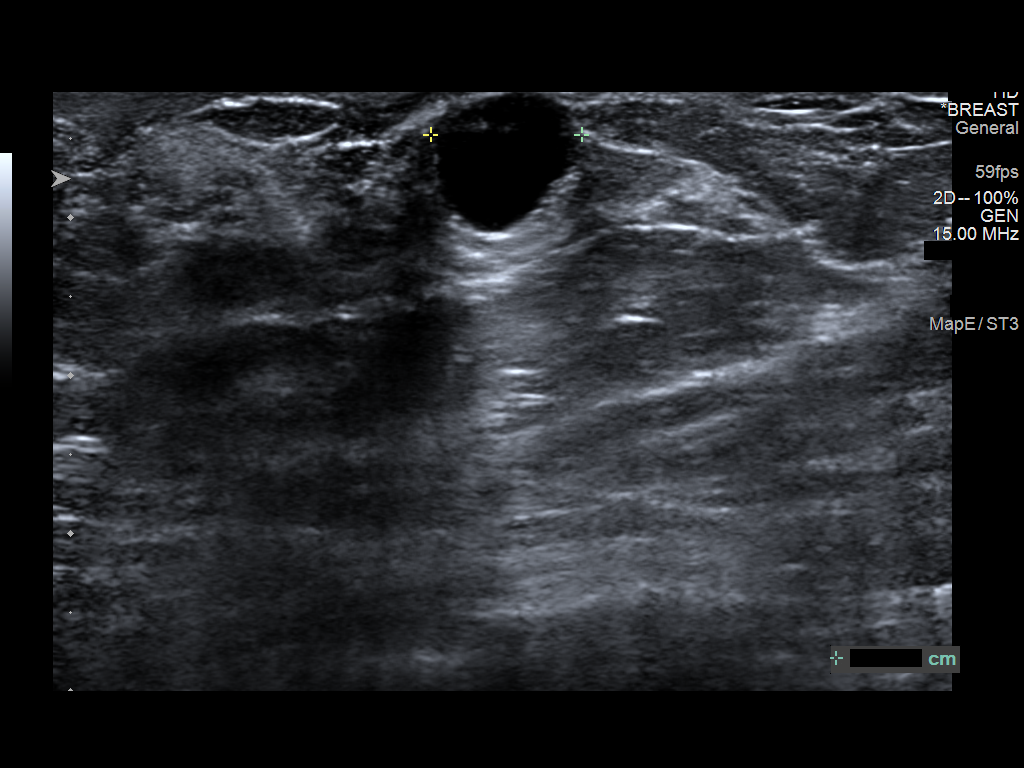
[im 5/6]
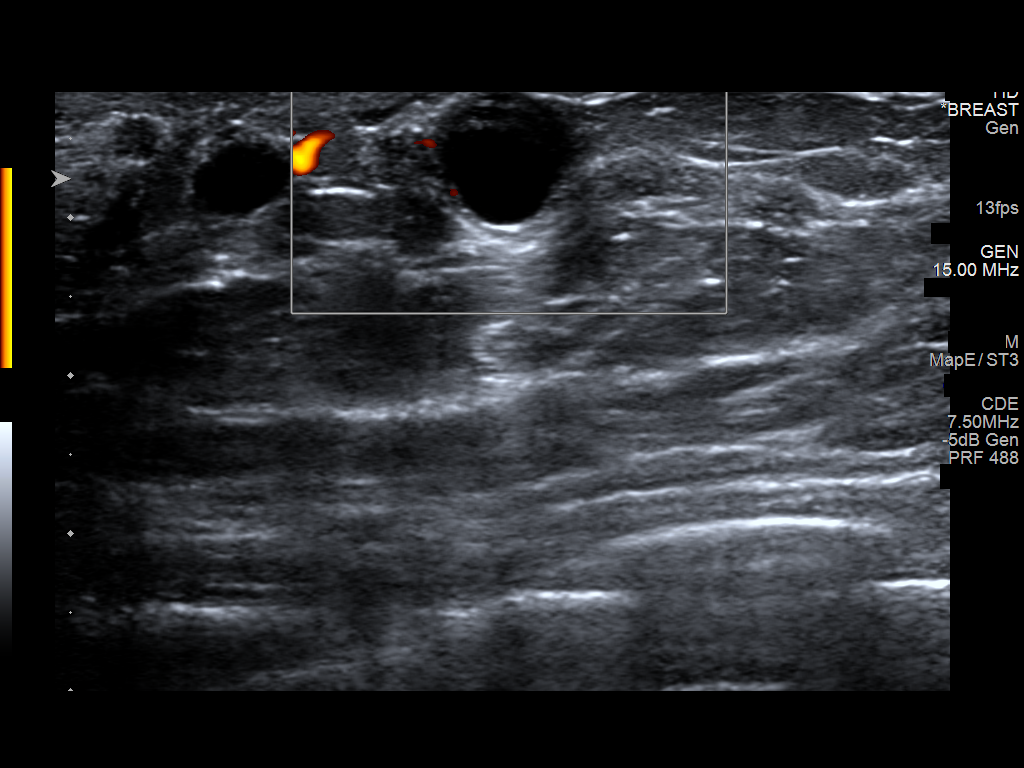
[im 6/6]
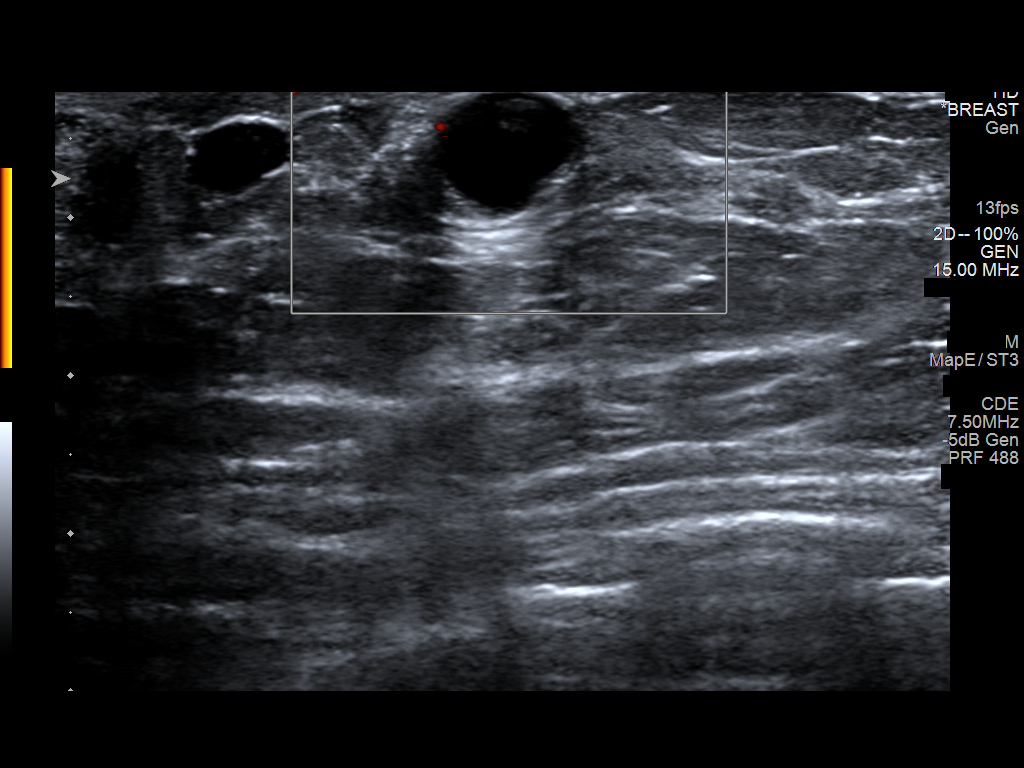

[6 of 6 positions shown; findings below may reference images not displayed]

ACR Breast Density Category b: There are scattered areas of
fibroglandular density.
FINDINGS: On the spot-compression MLO view, there is a small partly
circumscribed oval mass in the superficial soft tissues the left
breast just below the nipple, corresponding to the palpable
abnormality. There are no other discrete masses. There are no areas
of architectural distortion and no suspicious calcifications.

Mammographic images were processed with CAD.

On physical exam, there is a small smooth mobile mass in the
superficial soft tissues of the left breast, lower outer quadrant,
just below the areolar margin.

Targeted ultrasound is performed, showing a round circumscribed cyst
in the left breast at 4 o'clock, retroareolar region, corresponding
to the palpable abnormality. It measures 10 x 8 x 9 mm. No
complicating features. There are no solid masses or suspicious
lesions.
IMPRESSION: 1. Small benign left breast retroareolar cyst at 4 o'clock
corresponding to the palpable abnormality.
2. No evidence of breast malignancy.

RECOMMENDATION:
Screening mammogram in one year.(Code:7L-H-GG0)

I have discussed the findings and recommendations with the patient.
Results were also provided in writing at the conclusion of the
visit. If applicable, a reminder letter will be sent to the patient
regarding the next appointment.

BI-RADS CATEGORY  2: Benign.

## 2019-08-07 IMAGING — MG DIGITAL DIAGNOSTIC UNILATERAL LEFT MAMMOGRAM WITH TOMO AND CAD
6 series · 6 of 18 positions shown · non-contrast
Comparison: Previous exam(s).

ADDENDUM:
Patient's follow-up screening study should be in April 2018,
which is 1 year after previous bilateral screening study.
CLINICAL DATA: Patient presents with a small palpable mass in the
lateral, periareolar left breast.

EXAM:
DIGITAL DIAGNOSTIC LEFT MAMMOGRAM WITH CAD AND TOMO
ULTRASOUND LEFT BREAST

[L CC synth-2D]
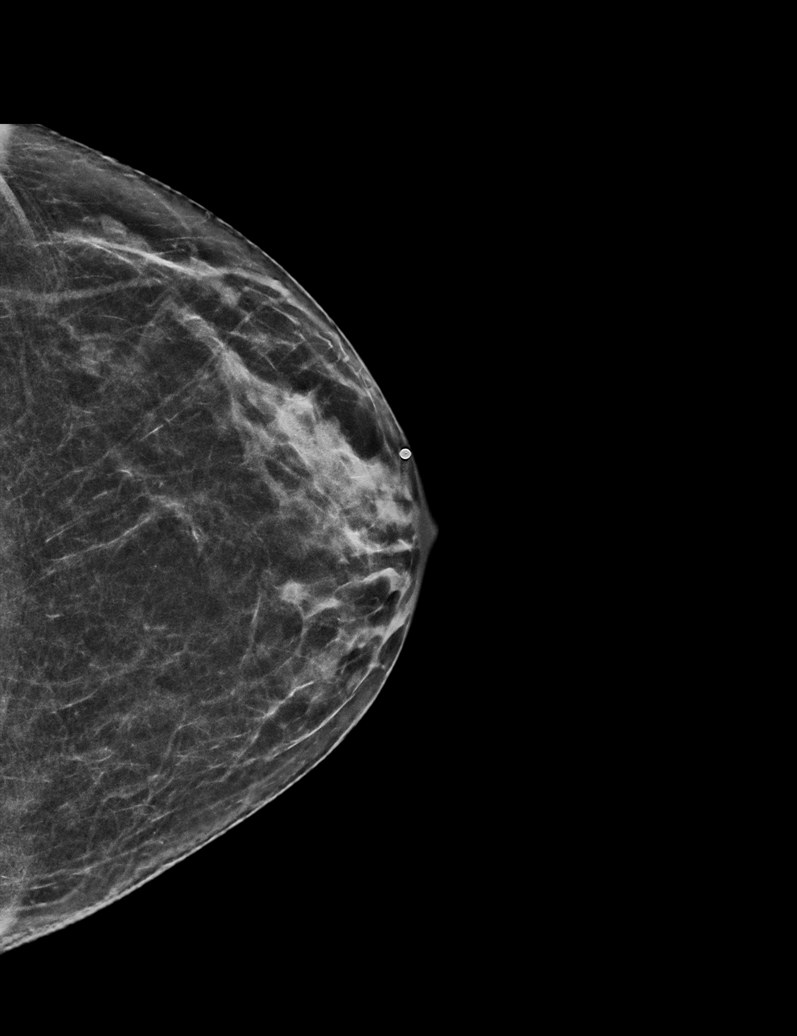

[L MLO synth-2D (1 of 2)]
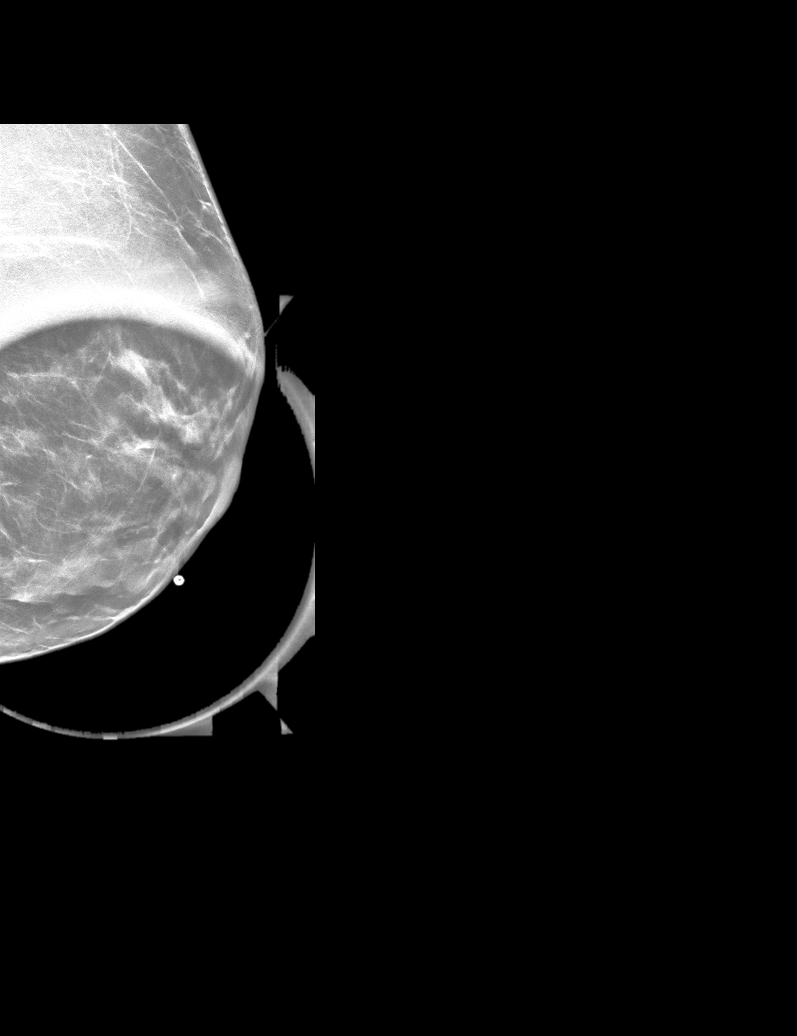

[L MLO synth-2D (2 of 2)]
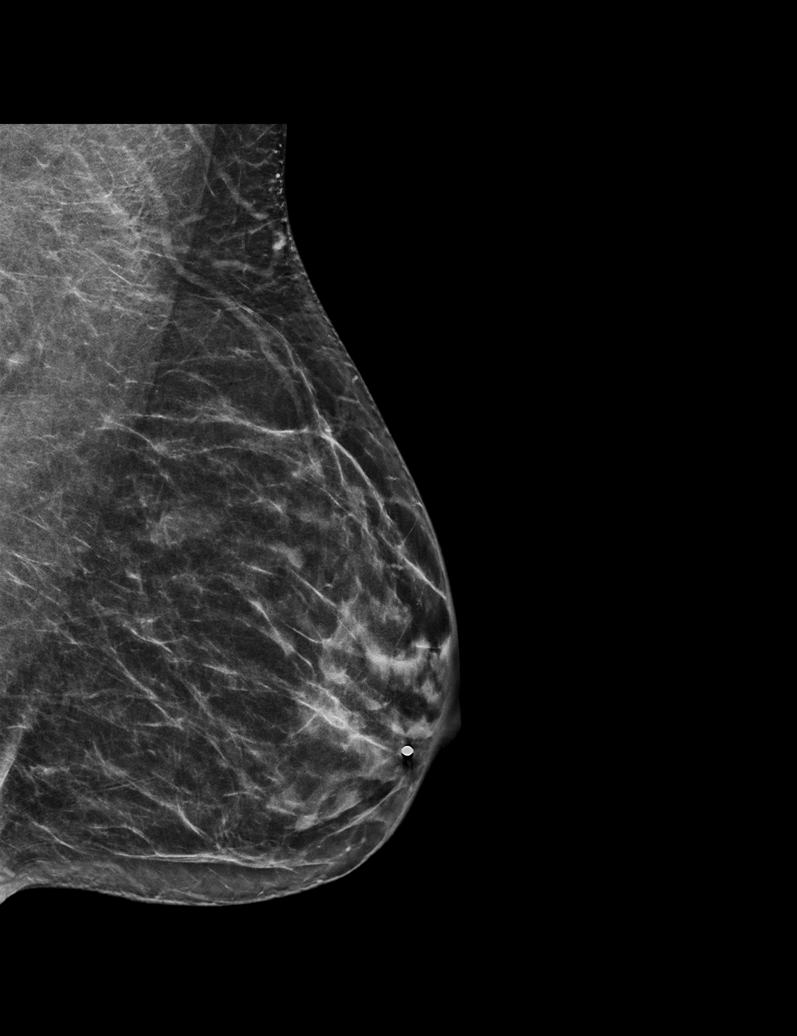

[L CC tomo · tomo slice 27/53.0]
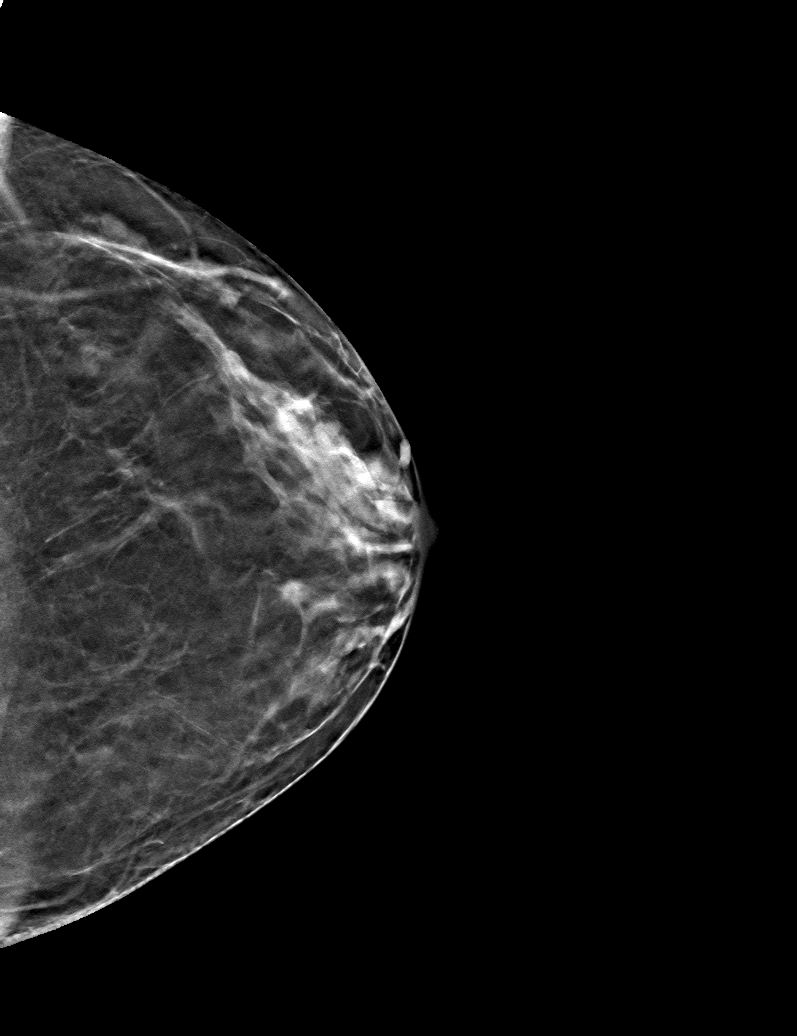

[L MLO tomo (1 of 2) · tomo slice 31/60.0]
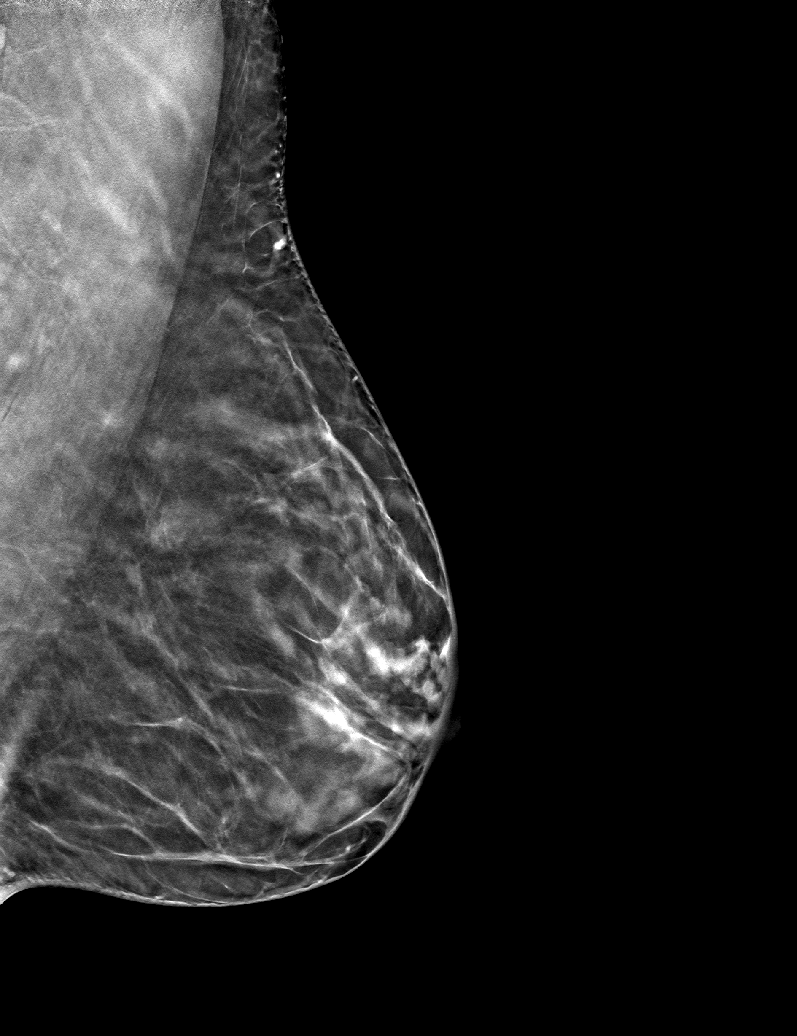

[L MLO tomo (2 of 2) · tomo slice 27/53.0]
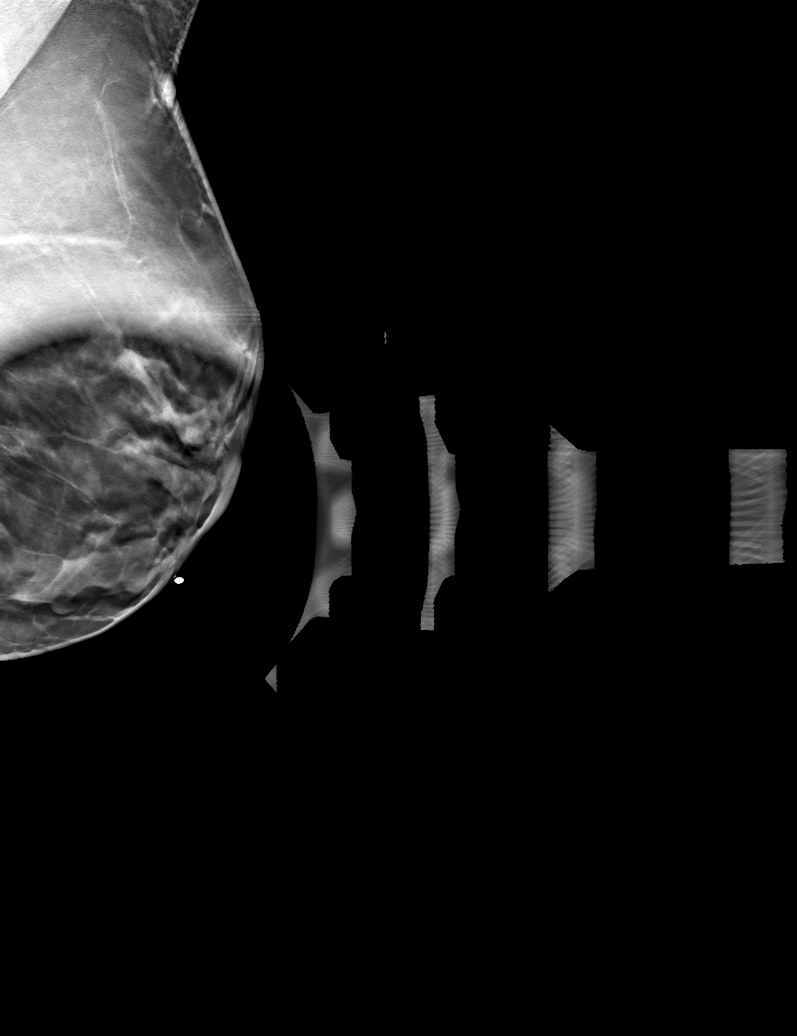

[6 of 18 positions shown; findings below may reference images not displayed]

ACR Breast Density Category b: There are scattered areas of
fibroglandular density.
FINDINGS: On the spot-compression MLO view, there is a small partly
circumscribed oval mass in the superficial soft tissues the left
breast just below the nipple, corresponding to the palpable
abnormality. There are no other discrete masses. There are no areas
of architectural distortion and no suspicious calcifications.

Mammographic images were processed with CAD.

On physical exam, there is a small smooth mobile mass in the
superficial soft tissues of the left breast, lower outer quadrant,
just below the areolar margin.

Targeted ultrasound is performed, showing a round circumscribed cyst
in the left breast at 4 o'clock, retroareolar region, corresponding
to the palpable abnormality. It measures 10 x 8 x 9 mm. No
complicating features. There are no solid masses or suspicious
lesions.
IMPRESSION: 1. Small benign left breast retroareolar cyst at 4 o'clock
corresponding to the palpable abnormality.
2. No evidence of breast malignancy.

RECOMMENDATION:
Screening mammogram in one year.(Code:7L-H-GG0)

I have discussed the findings and recommendations with the patient.
Results were also provided in writing at the conclusion of the
visit. If applicable, a reminder letter will be sent to the patient
regarding the next appointment.

BI-RADS CATEGORY  2: Benign.
